# Patient Record
Sex: Male | Born: 1975
Health system: Southern US, Community
[De-identification: ages and names within clinical notes are randomized; demographics above are authoritative.]

---

## 2011-09-09 DIAGNOSIS — IMO0001 Reserved for inherently not codable concepts without codable children: Secondary | ICD-10-CM | POA: Insufficient documentation

## 2012-09-08 DIAGNOSIS — J45909 Unspecified asthma, uncomplicated: Secondary | ICD-10-CM | POA: Insufficient documentation

## 2015-02-05 ENCOUNTER — Ambulatory Visit
Admission: RE | Admit: 2015-02-05 | Discharge: 2015-02-05 | Disposition: A | Discharge status: Home / Self Care | Payer: BLUE CROSS/BLUE SHIELD | Source: Ambulatory Visit | Attending: Physician Assistant | Admitting: Physician Assistant

## 2015-02-05 ENCOUNTER — Other Ambulatory Visit: Payer: Self-pay | Admitting: Physician Assistant

## 2015-02-05 DIAGNOSIS — R52 Pain, unspecified: Secondary | ICD-10-CM

## 2015-02-05 DIAGNOSIS — M7989 Other specified soft tissue disorders: Secondary | ICD-10-CM | POA: Diagnosis present

## 2015-02-05 DIAGNOSIS — M79674 Pain in right toe(s): Secondary | ICD-10-CM | POA: Insufficient documentation

## 2015-02-05 IMAGING — CR DG TOE GREAT 2+V*L*
1 series · 3 of 3 positions shown · non-contrast
Comparison: None available in PACs

CLINICAL DATA: Left great toe pain centered in the interphalangeal
joint, no known injury.

EXAM:
LEFT GREAT TOE

[Series 1: dg toe great left · 0.14mm/px · 3 of 3 slices shown]
[im 1/3]
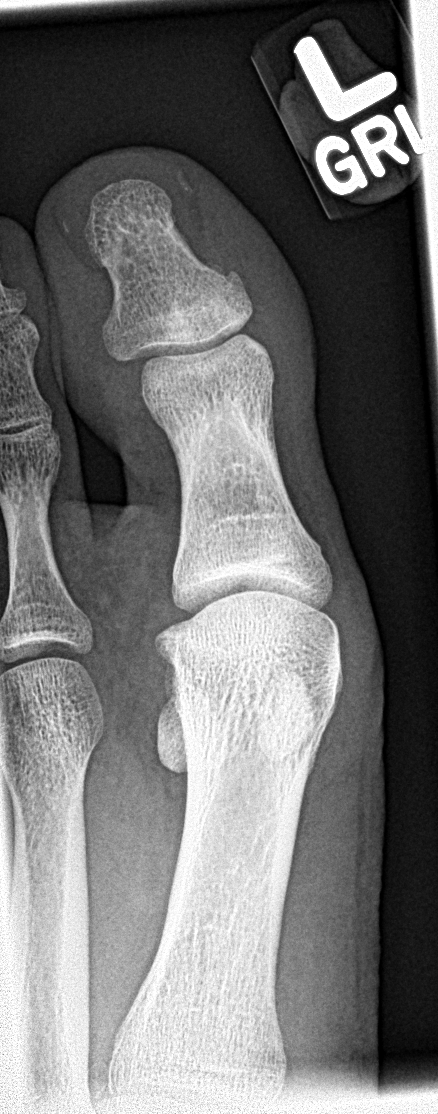
[im 2/3]
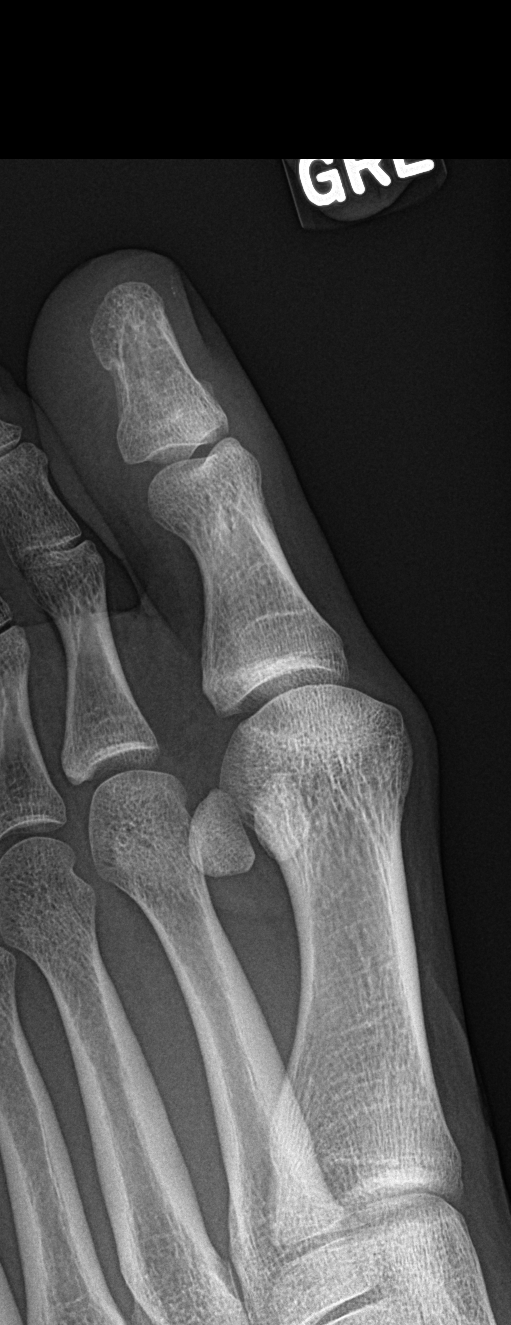
[im 3/3]
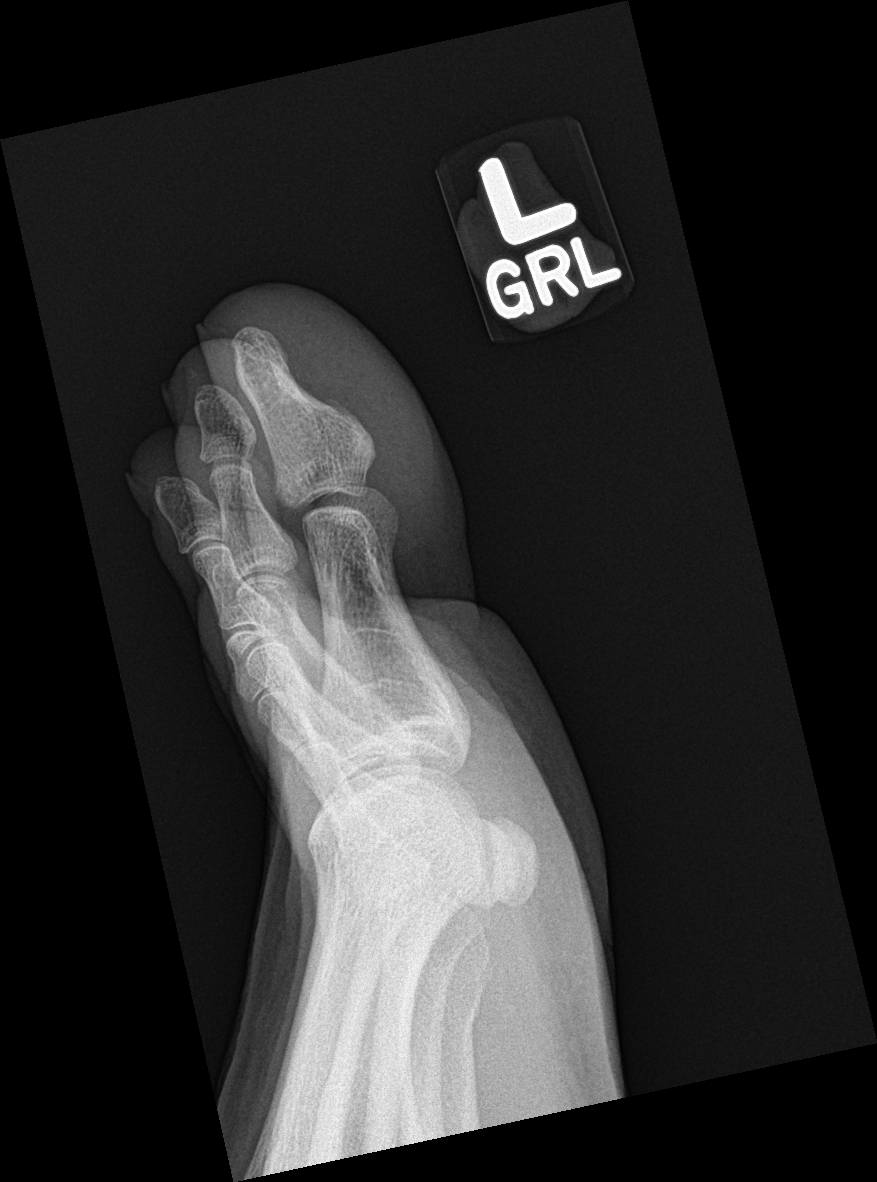

[3 of 3 positions shown; findings below may reference images not displayed]

FINDINGS: The bones of the great toe are adequately mineralized. The
interphalangeal and metatarsophalangeal joints are preserved. There
are no lytic or blastic bony lesions nor is there periosteal
reaction.
IMPRESSION: There is no acute or significant chronic bony abnormality of the
great toe.

## 2015-07-13 ENCOUNTER — Other Ambulatory Visit: Payer: Self-pay | Admitting: Family Medicine

## 2015-07-13 DIAGNOSIS — Q981 Klinefelter syndrome, male with more than two X chromosomes: Secondary | ICD-10-CM

## 2015-07-24 ENCOUNTER — Ambulatory Visit
Admission: RE | Admit: 2015-07-24 | Discharge: 2015-07-24 | Disposition: A | Discharge status: Home / Self Care | Payer: BLUE CROSS/BLUE SHIELD | Source: Ambulatory Visit | Attending: Family Medicine | Admitting: Family Medicine

## 2015-07-24 DIAGNOSIS — Q981 Klinefelter syndrome, male with more than two X chromosomes: Secondary | ICD-10-CM | POA: Diagnosis not present

## 2015-07-26 ENCOUNTER — Ambulatory Visit: Payer: BLUE CROSS/BLUE SHIELD

## 2018-06-16 DIAGNOSIS — R7989 Other specified abnormal findings of blood chemistry: Secondary | ICD-10-CM | POA: Insufficient documentation

## 2018-06-16 DIAGNOSIS — Q984 Klinefelter syndrome, unspecified: Secondary | ICD-10-CM | POA: Insufficient documentation

## 2018-07-08 DIAGNOSIS — H539 Unspecified visual disturbance: Secondary | ICD-10-CM | POA: Insufficient documentation

## 2018-07-08 DIAGNOSIS — H35712 Central serous chorioretinopathy, left eye: Secondary | ICD-10-CM | POA: Insufficient documentation

## 2018-07-08 DIAGNOSIS — I10 Essential (primary) hypertension: Secondary | ICD-10-CM | POA: Insufficient documentation

## 2018-10-12 DIAGNOSIS — M48062 Spinal stenosis, lumbar region with neurogenic claudication: Secondary | ICD-10-CM | POA: Insufficient documentation

## 2018-10-12 DIAGNOSIS — M21372 Foot drop, left foot: Secondary | ICD-10-CM | POA: Insufficient documentation

## 2018-10-12 DIAGNOSIS — M5106 Intervertebral disc disorders with myelopathy, lumbar region: Secondary | ICD-10-CM | POA: Insufficient documentation

## 2018-10-12 DIAGNOSIS — M5440 Lumbago with sciatica, unspecified side: Secondary | ICD-10-CM | POA: Insufficient documentation

## 2018-10-13 HISTORY — PX: BACK SURGERY: SHX140

## 2018-12-16 ENCOUNTER — Ambulatory Visit: Payer: BLUE CROSS/BLUE SHIELD | Admitting: Podiatry

## 2019-01-25 DIAGNOSIS — R7989 Other specified abnormal findings of blood chemistry: Secondary | ICD-10-CM | POA: Diagnosis not present

## 2019-01-25 DIAGNOSIS — Q984 Klinefelter syndrome, unspecified: Secondary | ICD-10-CM | POA: Diagnosis not present

## 2019-01-25 DIAGNOSIS — R718 Other abnormality of red blood cells: Secondary | ICD-10-CM | POA: Diagnosis not present

## 2019-04-06 ENCOUNTER — Other Ambulatory Visit: Payer: Self-pay

## 2019-04-06 DIAGNOSIS — I10 Essential (primary) hypertension: Secondary | ICD-10-CM

## 2019-04-06 DIAGNOSIS — K2100 Gastro-esophageal reflux disease with esophagitis, without bleeding: Secondary | ICD-10-CM

## 2019-04-06 MED ORDER — OMEPRAZOLE 40 MG PO CPDR: 40.0000 mg | DELAYED_RELEASE_CAPSULE | Freq: Every day | ORAL | 0 refills | Status: DC

## 2019-04-06 MED ORDER — LOSARTAN POTASSIUM 25 MG PO TABS: 25.0000 mg | ORAL_TABLET | Freq: Every day | ORAL | 0 refills | Status: DC

## 2019-04-06 NOTE — Telephone Encounter (Signed)
Last CMET 10/15/2018.  Urology managing testosterone injections now dx'd with Klinefelter's since last visit with me in 2019.  Last testosterone level jul 2020 and office visit BP 131/86.  [facility] [employer] physicals optional to employees this year due to covid pandemic.  Due urology follow up Oct 2020.  Magnesium level with next lab draw.  Renal function stable.  Next renal function check and BP due April 2021.  Cozaar initiated by Dr Darnell Level [facility] 06/23/2018 with buspar for hypertension.

## 2019-04-09 DIAGNOSIS — J45909 Unspecified asthma, uncomplicated: Secondary | ICD-10-CM | POA: Diagnosis not present

## 2019-04-14 ENCOUNTER — Ambulatory Visit: Payer: Self-pay

## 2019-04-14 DIAGNOSIS — Z23 Encounter for immunization: Secondary | ICD-10-CM

## 2019-04-18 DIAGNOSIS — R7989 Other specified abnormal findings of blood chemistry: Secondary | ICD-10-CM | POA: Diagnosis not present

## 2019-04-28 ENCOUNTER — Other Ambulatory Visit: Payer: Self-pay

## 2019-04-28 DIAGNOSIS — Z20822 Contact with and (suspected) exposure to covid-19: Secondary | ICD-10-CM

## 2019-04-28 DIAGNOSIS — Z20828 Contact with and (suspected) exposure to other viral communicable diseases: Secondary | ICD-10-CM | POA: Diagnosis not present

## 2019-04-30 LAB — NOVEL CORONAVIRUS, NAA: SARS-CoV-2, NAA: NOT DETECTED

## 2019-06-28 ENCOUNTER — Other Ambulatory Visit: Payer: Self-pay

## 2019-06-28 ENCOUNTER — Encounter: Payer: Self-pay | Admitting: Occupational Medicine

## 2019-06-28 ENCOUNTER — Ambulatory Visit: Payer: 59 | Admitting: Occupational Medicine

## 2019-06-28 VITALS — BP 112/80 | HR 69 | Temp 98.8°F | Resp 12 | Ht 72.0 in | Wt 183.0 lb

## 2019-06-28 DIAGNOSIS — Z0000 Encounter for general adult medical examination without abnormal findings: Secondary | ICD-10-CM

## 2019-06-28 LAB — POCT URINALYSIS DIPSTICK
Bilirubin, UA: NEGATIVE
Blood, UA: NEGATIVE
Glucose, UA: NEGATIVE
Ketones, UA: NEGATIVE
Leukocytes, UA: NEGATIVE
Nitrite, UA: NEGATIVE
Protein, UA: NEGATIVE
Spec Grav, UA: 1.02 (ref 1.010–1.025)
Urobilinogen, UA: 0.2 E.U./dL
pH, UA: 7 (ref 5.0–8.0)

## 2019-06-29 LAB — CMP12+LP+TP+TSH+6AC+PSA+CBC…
ALT: 15 IU/L (ref 0–44)
AST: 15 IU/L (ref 0–40)
Albumin/Globulin Ratio: 2 (ref 1.2–2.2)
Albumin: 4.6 g/dL (ref 4.0–5.0)
Alkaline Phosphatase: 70 IU/L (ref 39–117)
BUN/Creatinine Ratio: 18 (ref 9–20)
BUN: 20 mg/dL (ref 6–24)
Basophils Absolute: 0 10*3/uL (ref 0.0–0.2)
Basos: 1 %
Bilirubin Total: 0.9 mg/dL (ref 0.0–1.2)
Calcium: 9.8 mg/dL (ref 8.7–10.2)
Chloride: 100 mmol/L (ref 96–106)
Chol/HDL Ratio: 4.3 ratio (ref 0.0–5.0)
Cholesterol, Total: 214 mg/dL — ABNORMAL HIGH (ref 100–199)
Creatinine, Ser: 1.13 mg/dL (ref 0.76–1.27)
EOS (ABSOLUTE): 0.4 10*3/uL (ref 0.0–0.4)
Eos: 8 %
Estimated CHD Risk: 0.8 times avg. (ref 0.0–1.0)
Free Thyroxine Index: 1.3 (ref 1.2–4.9)
GFR calc Af Amer: 92 mL/min/{1.73_m2} (ref >59)
GFR calc non Af Amer: 80 mL/min/{1.73_m2} (ref >59)
GGT: 11 IU/L (ref 0–65)
Globulin, Total: 2.3 g/dL (ref 1.5–4.5)
Glucose: 100 mg/dL — ABNORMAL HIGH (ref 65–99)
HDL: 50 mg/dL (ref >39)
Hematocrit: 49.6 % (ref 37.5–51.0)
Hemoglobin: 16.9 g/dL (ref 13.0–17.7)
Immature Grans (Abs): 0 10*3/uL (ref 0.0–0.1)
Immature Granulocytes: 0 %
Iron: 147 ug/dL (ref 38–169)
LDH: 135 IU/L (ref 121–224)
LDL Chol Calc (NIH): 146 mg/dL — ABNORMAL HIGH (ref 0–99)
Lymphocytes Absolute: 2.1 10*3/uL (ref 0.7–3.1)
Lymphs: 40 %
MCH: 30.2 pg (ref 26.6–33.0)
MCHC: 34.1 g/dL (ref 31.5–35.7)
MCV: 89 fL (ref 79–97)
Monocytes Absolute: 0.5 10*3/uL (ref 0.1–0.9)
Monocytes: 9 %
Neutrophils Absolute: 2.2 10*3/uL (ref 1.4–7.0)
Neutrophils: 42 %
Phosphorus: 3.2 mg/dL (ref 2.8–4.1)
Platelets: 173 10*3/uL (ref 150–450)
Potassium: 4.5 mmol/L (ref 3.5–5.2)
Prostate Specific Ag, Serum: 1.3 ng/mL (ref 0.0–4.0)
RBC: 5.59 x10E6/uL (ref 4.14–5.80)
RDW: 11.8 % (ref 11.6–15.4)
Sodium: 139 mmol/L (ref 134–144)
T3 Uptake Ratio: 27 % (ref 24–39)
T4, Total: 4.7 ug/dL (ref 4.5–12.0)
TSH: 2.12 u[IU]/mL (ref 0.450–4.500)
Total Protein: 6.9 g/dL (ref 6.0–8.5)
Triglycerides: 103 mg/dL (ref 0–149)
Uric Acid: 5.3 mg/dL (ref 3.8–8.4)
VLDL Cholesterol Cal: 18 mg/dL (ref 5–40)
WBC: 5.2 10*3/uL (ref 3.4–10.8)

## 2019-07-05 NOTE — Progress Notes (Signed)
       [facility] [employer] annual exam documented in chart at [facility]

## 2019-07-12 ENCOUNTER — Encounter: Payer: Self-pay | Admitting: Registered Nurse

## 2019-07-12 ENCOUNTER — Other Ambulatory Visit: Payer: Self-pay | Admitting: Registered Nurse

## 2019-07-12 NOTE — Telephone Encounter (Signed)
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    Labs renal/liver function and electrolytes WNL  No magnesium.  Consider with next year physical as chronic use.  Hx reflux see outpatient paper record at [place] for Rx history omeprazole.  Electronic Rx omeprazole 40mg  po daily #90 RF3 sent to his pharmacy of choice.

## 2019-07-18 ENCOUNTER — Other Ambulatory Visit: Payer: Self-pay | Admitting: Registered Nurse

## 2019-07-18 DIAGNOSIS — I10 Essential (primary) hypertension: Secondary | ICD-10-CM

## 2019-07-19 ENCOUNTER — Encounter: Payer: Self-pay | Admitting: Registered Nurse

## 2019-07-19 NOTE — Telephone Encounter (Signed)
Labs 06/28/2019 renal and liver function stable.  BP 112/80 hr 69 BMI 24.  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  Electronic Rx sent to patient pharmacy of choice for one year supply cozaar 25mg  po daily #90 RF3.

## 2019-07-22 DIAGNOSIS — K219 Gastro-esophageal reflux disease without esophagitis: Secondary | ICD-10-CM | POA: Diagnosis not present

## 2019-07-23 DIAGNOSIS — Z20828 Contact with and (suspected) exposure to other viral communicable diseases: Secondary | ICD-10-CM | POA: Diagnosis not present

## 2020-02-27 ENCOUNTER — Other Ambulatory Visit: Payer: Self-pay

## 2020-02-27 ENCOUNTER — Ambulatory Visit: Payer: Self-pay

## 2020-02-27 DIAGNOSIS — Z0000 Encounter for general adult medical examination without abnormal findings: Secondary | ICD-10-CM

## 2020-02-27 LAB — POCT URINALYSIS DIPSTICK
Bilirubin, UA: NEGATIVE
Blood, UA: NEGATIVE
Glucose, UA: NEGATIVE
Ketones, UA: NEGATIVE
Leukocytes, UA: NEGATIVE
Nitrite, UA: NEGATIVE
Protein, UA: NEGATIVE
Spec Grav, UA: 1.02 (ref 1.010–1.025)
Urobilinogen, UA: 0.2 E.U./dL
pH, UA: 6 (ref 5.0–8.0)

## 2020-02-28 LAB — CMP12+LP+TP+TSH+6AC+PSA+CBC…
ALT: 21 IU/L (ref 0–44)
AST: 20 IU/L (ref 0–40)
Albumin/Globulin Ratio: 2.1 (ref 1.2–2.2)
Albumin: 4.9 g/dL (ref 4.0–5.0)
Alkaline Phosphatase: 66 IU/L (ref 48–121)
BUN/Creatinine Ratio: 23 — ABNORMAL HIGH (ref 9–20)
BUN: 22 mg/dL (ref 6–24)
Basophils Absolute: 0 10*3/uL (ref 0.0–0.2)
Basos: 0 %
Bilirubin Total: 0.6 mg/dL (ref 0.0–1.2)
Calcium: 9.7 mg/dL (ref 8.7–10.2)
Chloride: 102 mmol/L (ref 96–106)
Chol/HDL Ratio: 4.1 ratio (ref 0.0–5.0)
Cholesterol, Total: 207 mg/dL — ABNORMAL HIGH (ref 100–199)
Creatinine, Ser: 0.97 mg/dL (ref 0.76–1.27)
EOS (ABSOLUTE): 0.4 10*3/uL (ref 0.0–0.4)
Eos: 8 %
Estimated CHD Risk: 0.8 times avg. (ref 0.0–1.0)
Free Thyroxine Index: 1.3 (ref 1.2–4.9)
GFR calc Af Amer: 110 mL/min/{1.73_m2} (ref >59)
GFR calc non Af Amer: 95 mL/min/{1.73_m2} (ref >59)
GGT: 12 IU/L (ref 0–65)
Globulin, Total: 2.3 g/dL (ref 1.5–4.5)
Glucose: 90 mg/dL (ref 65–99)
HDL: 51 mg/dL (ref >39)
Hematocrit: 53.7 % — ABNORMAL HIGH (ref 37.5–51.0)
Hemoglobin: 17.8 g/dL — ABNORMAL HIGH (ref 13.0–17.7)
Immature Grans (Abs): 0 10*3/uL (ref 0.0–0.1)
Immature Granulocytes: 0 %
Iron: 91 ug/dL (ref 38–169)
LDH: 158 IU/L (ref 121–224)
LDL Chol Calc (NIH): 138 mg/dL — ABNORMAL HIGH (ref 0–99)
Lymphocytes Absolute: 2.1 10*3/uL (ref 0.7–3.1)
Lymphs: 42 %
MCH: 29 pg (ref 26.6–33.0)
MCHC: 33.1 g/dL (ref 31.5–35.7)
MCV: 88 fL (ref 79–97)
Monocytes Absolute: 0.5 10*3/uL (ref 0.1–0.9)
Monocytes: 10 %
Neutrophils Absolute: 2.1 10*3/uL (ref 1.4–7.0)
Neutrophils: 40 %
Phosphorus: 2.9 mg/dL (ref 2.8–4.1)
Platelets: 198 10*3/uL (ref 150–450)
Potassium: 4.8 mmol/L (ref 3.5–5.2)
Prostate Specific Ag, Serum: 1.3 ng/mL (ref 0.0–4.0)
RBC: 6.13 x10E6/uL — ABNORMAL HIGH (ref 4.14–5.80)
RDW: 13.2 % (ref 11.6–15.4)
Sodium: 140 mmol/L (ref 134–144)
T3 Uptake Ratio: 28 % (ref 24–39)
T4, Total: 4.7 ug/dL (ref 4.5–12.0)
TSH: 1.73 u[IU]/mL (ref 0.450–4.500)
Total Protein: 7.2 g/dL (ref 6.0–8.5)
Triglycerides: 101 mg/dL (ref 0–149)
Uric Acid: 4.7 mg/dL (ref 3.8–8.4)
VLDL Cholesterol Cal: 18 mg/dL (ref 5–40)
WBC: 5.1 10*3/uL (ref 3.4–10.8)

## 2020-02-29 LAB — QUANTIFERON-TB GOLD PLUS
QuantiFERON Mitogen Value: >10 IU/mL
QuantiFERON Nil Value: 0.02 IU/mL
QuantiFERON TB1 Ag Value: 0.01 IU/mL
QuantiFERON TB2 Ag Value: 0.03 IU/mL
QuantiFERON-TB Gold Plus: NEGATIVE

## 2020-03-13 ENCOUNTER — Encounter: Payer: Self-pay | Admitting: Emergency Medicine

## 2020-03-13 ENCOUNTER — Ambulatory Visit: Payer: Self-pay | Admitting: Emergency Medicine

## 2020-03-13 ENCOUNTER — Other Ambulatory Visit: Payer: Self-pay

## 2020-03-13 VITALS — BP 130/80 | HR 70 | Temp 97.3°F | Resp 12 | Ht 72.0 in | Wt 184.0 lb

## 2020-03-13 DIAGNOSIS — Z0000 Encounter for general adult medical examination without abnormal findings: Secondary | ICD-10-CM

## 2020-03-13 NOTE — Progress Notes (Signed)
Occupational Health Provider Note       Time seen: 8:47 AM    I have reviewed the vital signs and the nursing notes.  HISTORY   Chief Complaint Employment Physical Engineer, drilling)    HPI Zachary Gibson is a 44 y.o. male with a history of Back surgery  who presents today for annual physical examination.  Patient denies any complaints, has no concerns or recent sickness.  History reviewed. No pertinent past medical history.  Past Surgical History:  Procedure Laterality Date  . BACK SURGERY  10/2018   L4-L5    Allergies Patient has no known allergies.   Review of Systems Constitutional: Negative for fever. Cardiovascular: Negative for chest pain. Respiratory: Negative for shortness of breath. Gastrointestinal: Negative for abdominal pain, vomiting and diarrhea. Musculoskeletal: Negative for back pain. Skin: Negative for rash. Neurological: Negative for headaches, focal weakness or numbness.  All systems negative/normal/unremarkable except as stated in the HPI  ____________________________________________   PHYSICAL EXAM:  VITAL SIGNS: Vitals:   03/13/20 0839  BP: 130/80  Pulse: 70  Resp: 12  Temp: (!) 97.3 F (36.3 C)  SpO2: 99%    Constitutional: Alert and oriented. Well appearing and in no distress. Eyes: Conjunctivae are normal. Normal extraocular movements. ENT      Head: Normocephalic and atraumatic.      Nose: No congestion/rhinnorhea.      Mouth/Throat: Mucous membranes are moist.      Neck: No stridor. Cardiovascular: Normal rate, regular rhythm. No murmurs, rubs, or gallops. Respiratory: Normal respiratory effort without tachypnea nor retractions. Breath sounds are clear and equal bilaterally. No wheezes/rales/rhonchi. Gastrointestinal: Soft and nontender. Normal bowel sounds Musculoskeletal: Nontender with normal range of motion in extremities. No lower extremity tenderness nor edema. Neurologic:  Normal speech and language. No gross focal  neurologic deficits are appreciated.  Skin:  Skin is warm, dry and intact. No rash noted. Psychiatric: Speech and behavior are normal.  ____________________________________________  EKG: Interpreted by me.  Sinus bradycardia with rate of 51 bpm, normal PR interval, normal QRS, nonspecific ST repolarization variant, normal QT  ____________________________________________   LABS (pertinent positives/negatives)  Recent Results (from the past 2160 hour(s))  CMP12+LP+TP+TSH+6AC+PSA+CBC.     Status: Abnormal   Collection Time: 02/27/20  9:00 AM  Result Value Ref Range   Glucose 90 65 - 99 mg/dL   Uric Acid 4.7 3.8 - 8.4 mg/dL    Comment:            Therapeutic target for gout patients: <6.0   BUN 22 6 - 24 mg/dL   Creatinine, Ser 0.97 0.76 - 1.27 mg/dL   GFR calc non Af Amer 95 >59 mL/min/1.73   GFR calc Af Amer 110 >59 mL/min/1.73    Comment: **[facility] currently reports eGFR in compliance with the current**   recommendations of the [employer]. [facility] will   update reporting as new guidelines are published from the NKF-ASN   Task force.    BUN/Creatinine Ratio 23 (H) 9 - 20   Sodium 140 134 - 144 mmol/L   Potassium 4.8 3.5 - 5.2 mmol/L   Chloride 102 96 - 106 mmol/L   Calcium 9.7 8.7 - 10.2 mg/dL   Phosphorus 2.9 2.8 - 4.1 mg/dL   Total Protein 7.2 6.0 - 8.5 g/dL   Albumin 4.9 4.0 - 5.0 g/dL   Globulin, Total 2.3 1.5 - 4.5 g/dL   Albumin/Globulin Ratio 2.1 1.2 - 2.2   Bilirubin Total 0.6 0.0 - 1.2 mg/dL  Alkaline Phosphatase 66 48 - 121 IU/L   LDH 158 121 - 224 IU/L   AST 20 0 - 40 IU/L   ALT 21 0 - 44 IU/L   GGT 12 0 - 65 IU/L   Iron 91 38 - 169 ug/dL   Cholesterol, Total 207 (H) 100 - 199 mg/dL   Triglycerides 101 0 - 149 mg/dL   HDL 51 >39 mg/dL   VLDL Cholesterol Cal 18 5 - 40 mg/dL   LDL Chol Calc (NIH) 138 (H) 0 - 99 mg/dL   Chol/HDL Ratio 4.1 0.0 - 5.0 ratio    Comment:                                   T. Chol/HDL Ratio                                              Men  Women                               1/2 Avg.Risk  3.4    3.3                                   Avg.Risk  5.0    4.4                                2X Avg.Risk  9.6    7.1                                3X Avg.Risk 23.4   11.0    Estimated CHD Risk 0.8 0.0 - 1.0 times avg.    Comment: The CHD Risk is based on the T. Chol/HDL ratio. Other factors affect CHD Risk such as hypertension, smoking, diabetes, severe obesity, and family history of premature CHD.    TSH 1.730 0.450 - 4.500 uIU/mL   T4, Total 4.7 4.5 - 12.0 ug/dL   T3 Uptake Ratio 28 24 - 39 %   Free Thyroxine Index 1.3 1.2 - 4.9   Prostate Specific Ag, Serum 1.3 0.0 - 4.0 ng/mL    Comment: Roche ECLIA methodology. According to the American Urological Association, Serum PSA should decrease and remain at undetectable levels after radical prostatectomy. The AUA defines biochemical recurrence as an initial PSA value 0.2 ng/mL or greater followed by a subsequent confirmatory PSA value 0.2 ng/mL or greater. Values obtained with different assay methods or kits cannot be used interchangeably. Results cannot be interpreted as absolute evidence of the presence or absence of malignant disease.    WBC 5.1 3.4 - 10.8 x10E3/uL   RBC 6.13 (H) 4.14 - 5.80 x10E6/uL   Hemoglobin 17.8 (H) 13.0 - 17.7 g/dL   Hematocrit 53.7 (H) 37.5 - 51.0 %   MCV 88 79 - 97 fL   MCH 29.0 26.6 - 33.0 pg   MCHC 33.1 31 - 35 g/dL   RDW 13.2 11.6 - 15.4 %   Platelets 198 150 - 450 x10E3/uL   Neutrophils 40 Not Estab. %  Lymphs 42 Not Estab. %   Monocytes 10 Not Estab. %   Eos 8 Not Estab. %   Basos 0 Not Estab. %   Neutrophils Absolute 2.1 1 - 7 x10E3/uL   Lymphocytes Absolute 2.1 0 - 3 x10E3/uL   Monocytes Absolute 0.5 0 - 0 x10E3/uL   EOS (ABSOLUTE) 0.4 0.0 - 0.4 x10E3/uL   Basophils Absolute 0.0 0 - 0 x10E3/uL   Immature Granulocytes 0 Not Estab. %   Immature Grans (Abs) 0.0 0.0 - 0.1 x10E3/uL  QuantiFERON-TB Gold Plus      Status: None   Collection Time: 02/27/20  9:00 AM  Result Value Ref Range   QuantiFERON Incubation Incubation performed.    QuantiFERON Criteria Comment     Comment: The QuantiFERON-TB Gold Plus result is determined by subtracting the Nil value from either TB antigen (Ag) tube. The mitogen tube serves as a control for the test.    QuantiFERON TB1 Ag Value 0.01 IU/mL   QuantiFERON TB2 Ag Value 0.03 IU/mL   QuantiFERON Nil Value 0.02 IU/mL   QuantiFERON Mitogen Value >10.00 IU/mL   QuantiFERON-TB Gold Plus Negative Negative    Comment: Chemiluminescence immunoassay methodology  POCT urinalysis dipstick     Status: Normal   Collection Time: 02/27/20  9:39 AM  Result Value Ref Range   Color, UA yellow    Clarity, UA clear    Glucose, UA Negative Negative   Bilirubin, UA negative    Ketones, UA negative    Spec Grav, UA 1.020 1.010 - 1.025   Blood, UA negative    pH, UA 6.0 5.0 - 8.0   Protein, UA Negative Negative   Urobilinogen, UA 0.2 0.2 or 1.0 E.U./dL   Nitrite, UA negative    Leukocytes, UA Negative Negative   Appearance dark    Odor       ASSESSMENT AND PLAN  Annual physical examination   Plan: The patient had presented for annual physical exam. Patient's labs do reveal mild hyperlipidemia but otherwise is reassuring.  He is cleared for duty at this time.  Lenise Arena MD    Note: This note was generated in part or whole with voice recognition software. Voice recognition is usually quite accurate but there are transcription errors that can and very often do occur. I apologize for any typographical errors that were not detected and corrected.

## 2020-05-17 ENCOUNTER — Other Ambulatory Visit: Payer: Self-pay

## 2020-05-17 ENCOUNTER — Ambulatory Visit: Payer: Self-pay

## 2020-05-17 DIAGNOSIS — Z1152 Encounter for screening for COVID-19: Secondary | ICD-10-CM

## 2020-05-17 DIAGNOSIS — Z23 Encounter for immunization: Secondary | ICD-10-CM

## 2020-05-18 LAB — NOVEL CORONAVIRUS, NAA: SARS-CoV-2, NAA: NOT DETECTED

## 2020-05-18 LAB — SARS-COV-2, NAA 2 DAY TAT

## 2020-05-21 ENCOUNTER — Other Ambulatory Visit: Payer: 59

## 2020-08-14 DIAGNOSIS — I10 Essential (primary) hypertension: Secondary | ICD-10-CM | POA: Diagnosis not present

## 2020-08-14 DIAGNOSIS — Z1329 Encounter for screening for other suspected endocrine disorder: Secondary | ICD-10-CM | POA: Diagnosis not present

## 2020-08-14 DIAGNOSIS — Z23 Encounter for immunization: Secondary | ICD-10-CM | POA: Diagnosis not present

## 2020-08-14 DIAGNOSIS — Z130 Encounter for screening for diseases of the blood and blood-forming organs and certain disorders involving the immune mechanism: Secondary | ICD-10-CM | POA: Diagnosis not present

## 2020-08-14 DIAGNOSIS — Z13228 Encounter for screening for other metabolic disorders: Secondary | ICD-10-CM | POA: Diagnosis not present

## 2020-08-14 DIAGNOSIS — Z136 Encounter for screening for cardiovascular disorders: Secondary | ICD-10-CM | POA: Diagnosis not present

## 2021-01-22 DIAGNOSIS — M5136 Other intervertebral disc degeneration, lumbar region: Secondary | ICD-10-CM | POA: Diagnosis not present

## 2021-01-22 DIAGNOSIS — M48062 Spinal stenosis, lumbar region with neurogenic claudication: Secondary | ICD-10-CM | POA: Diagnosis not present

## 2021-01-22 DIAGNOSIS — M5432 Sciatica, left side: Secondary | ICD-10-CM | POA: Diagnosis not present

## 2021-01-22 DIAGNOSIS — M961 Postlaminectomy syndrome, not elsewhere classified: Secondary | ICD-10-CM | POA: Diagnosis not present

## 2021-01-25 DIAGNOSIS — M47816 Spondylosis without myelopathy or radiculopathy, lumbar region: Secondary | ICD-10-CM | POA: Diagnosis not present

## 2021-01-25 DIAGNOSIS — M5137 Other intervertebral disc degeneration, lumbosacral region: Secondary | ICD-10-CM | POA: Diagnosis not present

## 2021-01-25 DIAGNOSIS — M47817 Spondylosis without myelopathy or radiculopathy, lumbosacral region: Secondary | ICD-10-CM | POA: Diagnosis not present

## 2021-01-25 DIAGNOSIS — M48061 Spinal stenosis, lumbar region without neurogenic claudication: Secondary | ICD-10-CM | POA: Diagnosis not present

## 2021-01-25 DIAGNOSIS — M5136 Other intervertebral disc degeneration, lumbar region: Secondary | ICD-10-CM | POA: Diagnosis not present

## 2021-01-25 DIAGNOSIS — M4807 Spinal stenosis, lumbosacral region: Secondary | ICD-10-CM | POA: Diagnosis not present

## 2021-01-25 DIAGNOSIS — M25552 Pain in left hip: Secondary | ICD-10-CM | POA: Diagnosis not present

## 2021-01-25 DIAGNOSIS — M79605 Pain in left leg: Secondary | ICD-10-CM | POA: Diagnosis not present

## 2021-01-28 DIAGNOSIS — M48062 Spinal stenosis, lumbar region with neurogenic claudication: Secondary | ICD-10-CM | POA: Diagnosis not present

## 2021-01-28 DIAGNOSIS — M961 Postlaminectomy syndrome, not elsewhere classified: Secondary | ICD-10-CM | POA: Diagnosis not present

## 2021-01-28 DIAGNOSIS — M5106 Intervertebral disc disorders with myelopathy, lumbar region: Secondary | ICD-10-CM | POA: Diagnosis not present

## 2021-02-01 DIAGNOSIS — M48061 Spinal stenosis, lumbar region without neurogenic claudication: Secondary | ICD-10-CM | POA: Diagnosis not present

## 2021-02-01 DIAGNOSIS — M5416 Radiculopathy, lumbar region: Secondary | ICD-10-CM | POA: Diagnosis not present

## 2021-02-01 DIAGNOSIS — M5136 Other intervertebral disc degeneration, lumbar region: Secondary | ICD-10-CM | POA: Diagnosis not present

## 2021-02-07 DIAGNOSIS — M5136 Other intervertebral disc degeneration, lumbar region: Secondary | ICD-10-CM | POA: Diagnosis not present

## 2021-02-07 DIAGNOSIS — M5416 Radiculopathy, lumbar region: Secondary | ICD-10-CM | POA: Diagnosis not present

## 2021-02-18 DIAGNOSIS — M5416 Radiculopathy, lumbar region: Secondary | ICD-10-CM | POA: Diagnosis not present

## 2021-02-18 DIAGNOSIS — M48061 Spinal stenosis, lumbar region without neurogenic claudication: Secondary | ICD-10-CM | POA: Diagnosis not present

## 2021-02-18 DIAGNOSIS — M5136 Other intervertebral disc degeneration, lumbar region: Secondary | ICD-10-CM | POA: Diagnosis not present

## 2021-03-08 ENCOUNTER — Ambulatory Visit: Payer: Self-pay

## 2021-03-08 ENCOUNTER — Other Ambulatory Visit: Payer: Self-pay

## 2021-03-08 DIAGNOSIS — Z0000 Encounter for general adult medical examination without abnormal findings: Secondary | ICD-10-CM

## 2021-03-08 DIAGNOSIS — M79605 Pain in left leg: Secondary | ICD-10-CM | POA: Diagnosis not present

## 2021-03-08 DIAGNOSIS — M5459 Other low back pain: Secondary | ICD-10-CM | POA: Diagnosis not present

## 2021-03-08 LAB — POCT URINALYSIS DIPSTICK
Appearance: NORMAL
Bilirubin, UA: NEGATIVE
Blood, UA: NEGATIVE
Glucose, UA: NEGATIVE
Ketones, UA: NEGATIVE
Leukocytes, UA: NEGATIVE
Nitrite, UA: NEGATIVE
Protein, UA: NEGATIVE
Spec Grav, UA: 1.015 (ref 1.010–1.025)
Urobilinogen, UA: 0.2 E.U./dL
pH, UA: 7 (ref 5.0–8.0)

## 2021-03-08 NOTE — Progress Notes (Signed)
Annual physical scheduled 03/14/21  Reviewed CDC recommendations for importance of HIV/ Hep C screening once in lifetime. Patient has declined HIV / Hep C screenings today and will let us know if they should change their mind in the future.

## 2021-03-09 LAB — CMP12+LP+TP+TSH+6AC+PSA+CBC…
ALT: 21 IU/L (ref 0–44)
AST: 19 IU/L (ref 0–40)
Albumin/Globulin Ratio: 2.3 — ABNORMAL HIGH (ref 1.2–2.2)
Albumin: 4.8 g/dL (ref 4.0–5.0)
Alkaline Phosphatase: 67 IU/L (ref 44–121)
BUN/Creatinine Ratio: 16 (ref 9–20)
BUN: 16 mg/dL (ref 6–24)
Basophils Absolute: 0 10*3/uL (ref 0.0–0.2)
Basos: 0 %
Bilirubin Total: 0.7 mg/dL (ref 0.0–1.2)
Calcium: 9.6 mg/dL (ref 8.7–10.2)
Chloride: 101 mmol/L (ref 96–106)
Chol/HDL Ratio: 4 ratio (ref 0.0–5.0)
Cholesterol, Total: 179 mg/dL (ref 100–199)
Creatinine, Ser: 1.03 mg/dL (ref 0.76–1.27)
EOS (ABSOLUTE): 0.2 10*3/uL (ref 0.0–0.4)
Eos: 4 %
Estimated CHD Risk: 0.7 times avg. (ref 0.0–1.0)
Free Thyroxine Index: 1.5 (ref 1.2–4.9)
GGT: 13 IU/L (ref 0–65)
Globulin, Total: 2.1 g/dL (ref 1.5–4.5)
Glucose: 95 mg/dL (ref 65–99)
HDL: 45 mg/dL (ref >39)
Hematocrit: 47.8 % (ref 37.5–51.0)
Hemoglobin: 16.3 g/dL (ref 13.0–17.7)
Immature Grans (Abs): 0 10*3/uL (ref 0.0–0.1)
Immature Granulocytes: 0 %
Iron: 134 ug/dL (ref 38–169)
LDH: 155 IU/L (ref 121–224)
LDL Chol Calc (NIH): 111 mg/dL — ABNORMAL HIGH (ref 0–99)
Lymphocytes Absolute: 2.3 10*3/uL (ref 0.7–3.1)
Lymphs: 47 %
MCH: 29.8 pg (ref 26.6–33.0)
MCHC: 34.1 g/dL (ref 31.5–35.7)
MCV: 87 fL (ref 79–97)
Monocytes Absolute: 0.6 10*3/uL (ref 0.1–0.9)
Monocytes: 12 %
Neutrophils Absolute: 1.8 10*3/uL (ref 1.4–7.0)
Neutrophils: 37 %
Phosphorus: 2.8 mg/dL (ref 2.8–4.1)
Platelets: 189 10*3/uL (ref 150–450)
Potassium: 4.4 mmol/L (ref 3.5–5.2)
Prostate Specific Ag, Serum: 1.3 ng/mL (ref 0.0–4.0)
RBC: 5.47 x10E6/uL (ref 4.14–5.80)
RDW: 12.5 % (ref 11.6–15.4)
Sodium: 139 mmol/L (ref 134–144)
T3 Uptake Ratio: 28 % (ref 24–39)
T4, Total: 5.4 ug/dL (ref 4.5–12.0)
TSH: 2.02 u[IU]/mL (ref 0.450–4.500)
Total Protein: 6.9 g/dL (ref 6.0–8.5)
Triglycerides: 130 mg/dL (ref 0–149)
Uric Acid: 5.1 mg/dL (ref 3.8–8.4)
VLDL Cholesterol Cal: 23 mg/dL (ref 5–40)
WBC: 4.8 10*3/uL (ref 3.4–10.8)
eGFR: 92 mL/min/{1.73_m2} (ref >59)

## 2021-03-11 DIAGNOSIS — M79605 Pain in left leg: Secondary | ICD-10-CM | POA: Diagnosis not present

## 2021-03-11 DIAGNOSIS — M5459 Other low back pain: Secondary | ICD-10-CM | POA: Diagnosis not present

## 2021-03-14 ENCOUNTER — Encounter: Payer: 59 | Admitting: Physician Assistant

## 2021-03-14 ENCOUNTER — Ambulatory Visit: Payer: Self-pay | Admitting: Physician Assistant

## 2021-03-14 ENCOUNTER — Encounter: Payer: Self-pay | Admitting: Physician Assistant

## 2021-03-14 ENCOUNTER — Other Ambulatory Visit: Payer: Self-pay

## 2021-03-14 VITALS — BP 126/83 | HR 83 | Temp 97.8°F | Resp 12 | Ht 72.0 in | Wt 190.0 lb

## 2021-03-14 DIAGNOSIS — M5459 Other low back pain: Secondary | ICD-10-CM | POA: Diagnosis not present

## 2021-03-14 DIAGNOSIS — M79605 Pain in left leg: Secondary | ICD-10-CM | POA: Diagnosis not present

## 2021-03-14 DIAGNOSIS — Z0000 Encounter for general adult medical examination without abnormal findings: Secondary | ICD-10-CM

## 2021-03-14 NOTE — Progress Notes (Signed)
Pt denies any issues or concerns at this time/CL,RMA ?

## 2021-03-14 NOTE — Progress Notes (Signed)
   Subjective: Annual physical exam    Patient ID: Zachary Gibson, male    DOB: 1975/12/22, 45 y.o.   MRN: [id number]  HPI Patient presented annual physical exam.  No concerning complaints.   Review of Systems Anxiety, GERD, hypertension, Klinefelter syndrome, and lumbar stenosis.    Objective:   Physical Exam No acute distress.  Temperature 97.8, pulse 83, respiration 12, BP is 126/83, and patient 90% O2 sat on room air.  Patient weighs 190 pounds and BMI is 25.8. HEENT was unremarkable.  Neck was supple without adenopathy or bruits.  Lungs are clear to auscultation.  Heart was regular rate and rhythm.  EKG showed asymptomatic bradycardia. Abdomen with negative HSM, normoactive bowel sounds, soft, nontender to palpation. No obvious deformity to the upper or lower extremities.  Patient has full and equal range of motion of the upper and lower extremities. No obvious deformity to the cervical lumbar spine.  Patient had full and equal range of motion cervical lumbar spine. Cranial nerves II through XII are grossly intact.  DTRs are 2+ without clonus.       Assessment & Plan: Well exam.   Discussed no acute findings on lab results with patient.  Advised to follow-up as necessary.

## 2021-03-20 DIAGNOSIS — M5459 Other low back pain: Secondary | ICD-10-CM | POA: Diagnosis not present

## 2021-03-20 DIAGNOSIS — M79605 Pain in left leg: Secondary | ICD-10-CM | POA: Diagnosis not present

## 2021-03-25 DIAGNOSIS — M5459 Other low back pain: Secondary | ICD-10-CM | POA: Diagnosis not present

## 2021-03-25 DIAGNOSIS — M79605 Pain in left leg: Secondary | ICD-10-CM | POA: Diagnosis not present

## 2021-03-29 DIAGNOSIS — M5459 Other low back pain: Secondary | ICD-10-CM | POA: Diagnosis not present

## 2021-03-29 DIAGNOSIS — M79605 Pain in left leg: Secondary | ICD-10-CM | POA: Diagnosis not present

## 2021-04-01 DIAGNOSIS — M79605 Pain in left leg: Secondary | ICD-10-CM | POA: Diagnosis not present

## 2021-04-01 DIAGNOSIS — M5459 Other low back pain: Secondary | ICD-10-CM | POA: Diagnosis not present

## 2021-04-04 DIAGNOSIS — M5459 Other low back pain: Secondary | ICD-10-CM | POA: Diagnosis not present

## 2021-04-04 DIAGNOSIS — M79605 Pain in left leg: Secondary | ICD-10-CM | POA: Diagnosis not present

## 2021-04-10 DIAGNOSIS — M79605 Pain in left leg: Secondary | ICD-10-CM | POA: Diagnosis not present

## 2021-04-10 DIAGNOSIS — M5459 Other low back pain: Secondary | ICD-10-CM | POA: Diagnosis not present

## 2021-04-19 DIAGNOSIS — M5459 Other low back pain: Secondary | ICD-10-CM | POA: Diagnosis not present

## 2021-04-19 DIAGNOSIS — M79605 Pain in left leg: Secondary | ICD-10-CM | POA: Diagnosis not present

## 2021-04-24 ENCOUNTER — Other Ambulatory Visit: Payer: Self-pay

## 2021-04-24 NOTE — Progress Notes (Signed)
ETOH: cleared   UDS: pending [facility] specimen ID: [id number]

## 2021-04-25 DIAGNOSIS — M79605 Pain in left leg: Secondary | ICD-10-CM | POA: Diagnosis not present

## 2021-04-25 DIAGNOSIS — M5459 Other low back pain: Secondary | ICD-10-CM | POA: Diagnosis not present

## 2021-05-01 DIAGNOSIS — M5459 Other low back pain: Secondary | ICD-10-CM | POA: Diagnosis not present

## 2021-05-01 DIAGNOSIS — M79605 Pain in left leg: Secondary | ICD-10-CM | POA: Diagnosis not present

## 2021-05-16 DIAGNOSIS — M79605 Pain in left leg: Secondary | ICD-10-CM | POA: Diagnosis not present

## 2021-05-16 DIAGNOSIS — M5459 Other low back pain: Secondary | ICD-10-CM | POA: Diagnosis not present

## 2021-06-12 DIAGNOSIS — M5459 Other low back pain: Secondary | ICD-10-CM | POA: Diagnosis not present

## 2021-06-12 DIAGNOSIS — M79605 Pain in left leg: Secondary | ICD-10-CM | POA: Diagnosis not present

## 2021-07-04 DIAGNOSIS — M5459 Other low back pain: Secondary | ICD-10-CM | POA: Diagnosis not present

## 2021-07-04 DIAGNOSIS — M79605 Pain in left leg: Secondary | ICD-10-CM | POA: Diagnosis not present

## 2021-09-05 ENCOUNTER — Other Ambulatory Visit: Payer: Self-pay | Admitting: Emergency Medicine

## 2021-09-06 ENCOUNTER — Telehealth: Payer: Self-pay

## 2021-09-06 ENCOUNTER — Other Ambulatory Visit: Payer: Self-pay | Admitting: Emergency Medicine

## 2021-09-06 NOTE — Telephone Encounter (Signed)
Called [place] to check on Testosterone refill that was sent to our clinic for refill. We have not checked a testosterone level since 2020. [place] said Dr. Lenise Arena is his PCP & Rx refill should go to him [place] will contact [facility] to have Rx refill sent to Dr. Lenise Arena at his current practice.  AMD

## 2021-12-31 DIAGNOSIS — M7712 Lateral epicondylitis, left elbow: Secondary | ICD-10-CM | POA: Diagnosis not present

## 2022-01-18 ENCOUNTER — Other Ambulatory Visit: Payer: Self-pay | Admitting: Emergency Medicine

## 2022-02-25 DIAGNOSIS — B078 Other viral warts: Secondary | ICD-10-CM | POA: Diagnosis not present

## 2022-02-25 DIAGNOSIS — Z09 Encounter for follow-up examination after completed treatment for conditions other than malignant neoplasm: Secondary | ICD-10-CM | POA: Diagnosis not present

## 2022-02-25 DIAGNOSIS — Z86018 Personal history of other benign neoplasm: Secondary | ICD-10-CM | POA: Diagnosis not present

## 2022-02-25 DIAGNOSIS — D225 Melanocytic nevi of trunk: Secondary | ICD-10-CM | POA: Diagnosis not present

## 2022-02-25 DIAGNOSIS — L578 Other skin changes due to chronic exposure to nonionizing radiation: Secondary | ICD-10-CM | POA: Diagnosis not present

## 2022-03-18 ENCOUNTER — Ambulatory Visit: Payer: 59

## 2022-03-18 DIAGNOSIS — Z0000 Encounter for general adult medical examination without abnormal findings: Secondary | ICD-10-CM

## 2022-03-18 LAB — POCT URINALYSIS DIPSTICK
Bilirubin, UA: NEGATIVE
Blood, UA: NEGATIVE
Glucose, UA: NEGATIVE
Ketones, UA: NEGATIVE
Leukocytes, UA: NEGATIVE
Nitrite, UA: NEGATIVE
Protein, UA: NEGATIVE
Spec Grav, UA: 1.02 (ref 1.010–1.025)
Urobilinogen, UA: 0.2 E.U./dL
pH, UA: 7 (ref 5.0–8.0)

## 2022-03-18 NOTE — Progress Notes (Signed)
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

## 2022-03-19 LAB — CMP12+LP+TP+TSH+6AC+PSA+CBC…
ALT: 36 IU/L (ref 0–44)
AST: 25 IU/L (ref 0–40)
Albumin/Globulin Ratio: 2.6 — ABNORMAL HIGH (ref 1.2–2.2)
Albumin: 4.6 g/dL (ref 4.1–5.1)
Alkaline Phosphatase: 64 IU/L (ref 44–121)
BUN/Creatinine Ratio: 15 (ref 9–20)
BUN: 16 mg/dL (ref 6–24)
Basophils Absolute: 0 10*3/uL (ref 0.0–0.2)
Basos: 1 %
Bilirubin Total: 0.6 mg/dL (ref 0.0–1.2)
Calcium: 9.6 mg/dL (ref 8.7–10.2)
Chloride: 102 mmol/L (ref 96–106)
Chol/HDL Ratio: 4.2 ratio (ref 0.0–5.0)
Cholesterol, Total: 189 mg/dL (ref 100–199)
Creatinine, Ser: 1.08 mg/dL (ref 0.76–1.27)
EOS (ABSOLUTE): 0.2 10*3/uL (ref 0.0–0.4)
Eos: 4 %
Estimated CHD Risk: 0.8 times avg. (ref 0.0–1.0)
Free Thyroxine Index: 1.5 (ref 1.2–4.9)
GGT: 15 IU/L (ref 0–65)
Globulin, Total: 1.8 g/dL (ref 1.5–4.5)
Glucose: 97 mg/dL (ref 70–99)
HDL: 45 mg/dL (ref >39)
Hematocrit: 47 % (ref 37.5–51.0)
Hemoglobin: 16.2 g/dL (ref 13.0–17.7)
Immature Grans (Abs): 0 10*3/uL (ref 0.0–0.1)
Immature Granulocytes: 0 %
Iron: 115 ug/dL (ref 38–169)
LDH: 147 IU/L (ref 121–224)
LDL Chol Calc (NIH): 126 mg/dL — ABNORMAL HIGH (ref 0–99)
Lymphocytes Absolute: 2.1 10*3/uL (ref 0.7–3.1)
Lymphs: 43 %
MCH: 29.7 pg (ref 26.6–33.0)
MCHC: 34.5 g/dL (ref 31.5–35.7)
MCV: 86 fL (ref 79–97)
Monocytes Absolute: 0.5 10*3/uL (ref 0.1–0.9)
Monocytes: 9 %
Neutrophils Absolute: 2.1 10*3/uL (ref 1.4–7.0)
Neutrophils: 43 %
Phosphorus: 2.7 mg/dL — ABNORMAL LOW (ref 2.8–4.1)
Platelets: 185 10*3/uL (ref 150–450)
Potassium: 4.2 mmol/L (ref 3.5–5.2)
Prostate Specific Ag, Serum: 1.3 ng/mL (ref 0.0–4.0)
RBC: 5.45 x10E6/uL (ref 4.14–5.80)
RDW: 12.5 % (ref 11.6–15.4)
Sodium: 139 mmol/L (ref 134–144)
T3 Uptake Ratio: 30 % (ref 24–39)
T4, Total: 4.9 ug/dL (ref 4.5–12.0)
TSH: 2.18 u[IU]/mL (ref 0.450–4.500)
Total Protein: 6.4 g/dL (ref 6.0–8.5)
Triglycerides: 96 mg/dL (ref 0–149)
Uric Acid: 5.3 mg/dL (ref 3.8–8.4)
VLDL Cholesterol Cal: 18 mg/dL (ref 5–40)
WBC: 4.9 10*3/uL (ref 3.4–10.8)
eGFR: 86 mL/min/{1.73_m2} (ref >59)

## 2022-03-24 ENCOUNTER — Encounter: Payer: Self-pay | Admitting: Physician Assistant

## 2022-03-24 ENCOUNTER — Ambulatory Visit: Payer: Self-pay | Admitting: Physician Assistant

## 2022-03-24 VITALS — BP 126/91 | HR 70 | Temp 98.4°F | Resp 14 | Ht 72.0 in | Wt 190.0 lb

## 2022-03-24 DIAGNOSIS — Z0000 Encounter for general adult medical examination without abnormal findings: Secondary | ICD-10-CM

## 2022-03-24 NOTE — Progress Notes (Signed)
[facility] [place] [facility]  ____________________________________________   None    (approximate)  I have reviewed the triage vital signs and the nursing notes.   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  Patient voiced no concerns or complaints.        History reviewed. No pertinent past medical history.  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 Authorizing Provider  budesonide-formoterol (SYMBICORT) 80-4.5 MCG/ACT inhaler INHALE 2 PUFFS BY MOUTH EVERY 12 (TWELVE) HOURS., NEEDS AN APPT 02/05/15  Yes [provider]  busPIRone (BUSPAR) 5 MG tablet Take 5 mg by mouth 2 (two) times daily. 05/04/19  Yes [provider]  diphenhydrAMINE (SOMINEX) 25 MG tablet Take by mouth.   Yes [provider]  losartan (COZAAR) 25 MG tablet TAKE 1 TABLET BY MOUTH EVERY DAY 07/19/19  Yes Betancourt, Tina A, NP  NEEDLE, DISP, 25 G (BD ECLIPSE) 25G X 1-1/2" MISC 1 mL every 7 days 07/28/18  Yes [provider]  NEEDLE, REUSABLE, 16 G (YALE REUSABLE NEEDLE 16GX1.5") 16G X 1-1/2" MISC 0.5 ml every 7 days 07/28/18  Yes [provider]  omeprazole (PRILOSEC) 40 MG capsule TAKE 1 CAPSULE BY MOUTH EVERY DAY 07/12/19  Yes Betancourt, Aura Fey, NP   testosterone cypionate (DEPOTESTOSTERONE CYPIONATE) 200 MG/ML injection 0.25 mg. 03/04/19  Yes [provider]    Allergies Patient has no known allergies.  Family History  Problem Relation Age of Onset   Asthma Father    Lung cancer Father     Social History Social History   Tobacco Use   Smoking status: Never   Smokeless tobacco: Former    Review of Systems Constitutional: No fever/chills Eyes: No visual changes. ENT: No sore throat. Cardiovascular: Denies chest pain. Respiratory: Denies shortness of breath. Gastrointestinal: No abdominal pain.  No nausea, no vomiting.  No diarrhea.  No constipation. Genitourinary: Negative for dysuria. Musculoskeletal: Negative for back pain. Skin: Negative for rash. Neurological: Negative for headaches, focal weakness or numbness. ____________________________________________   PHYSICAL EXAM:  VITAL SIGNS: BP is 126/91, pulse 70, respiration 14, temperature 98.4, patient 90% O2 sat on room air.  Patient was 190 pounds and BMI is 25.77. Constitutional: Alert and oriented. Well appearing and in no acute distress. Eyes: Conjunctivae are normal. PERRL. EOMI. Head: Atraumatic. Nose: No congestion/rhinnorhea. Mouth/Throat: Mucous membranes are moist.  Oropharynx non-erythematous. Neck: No stridor.  No cervical spine tenderness to palpation. Hematological/Lymphatic/Immunilogical: No cervical lymphadenopathy. Cardiovascular: Normal rate, regular rhythm. Grossly normal heart sounds.  Good peripheral circulation. Respiratory: Normal respiratory effort.  No retractions. Lungs CTAB. Gastrointestinal: Soft and nontender. No distention. No abdominal bruits. No CVA tenderness. Genitourinary: Deferred Musculoskeletal: No lower extremity tenderness nor edema.  No joint effusions. Neurologic:  Normal speech and language. No gross focal neurologic deficits  are appreciated. No gait instability. Skin:  Skin is warm, dry and intact. No rash  noted.  Lower lumbar scar consistent with surgery. Psychiatric: Mood and affect are normal. Speech and behavior are normal.  ____________________________________________   LABS _Order: [id number] Status: Final result    Visible to patient: Yes (not seen)    Next appt: None    Dx: Annual physical exam    0 Result Notes        Component Ref Range & Units 6 d ago (03/18/22) 1 yr ago (03/08/21) 2 yr ago (02/27/20) 2 yr ago (06/28/19)  Glucose 70 - 99 mg/dL 97  95 R  90 R  100 High  R   Uric Acid 3.8 - 8.4 mg/dL 5.3  5.1 CM  4.7 CM  5.3 CM   Comment:            Therapeutic target for gout patients: <6.0  BUN 6 - 24 mg/dL _0 Creatinine, Ser 0.76 - 1.27 mg/dL 1.08  1.03  0.97  1.13   eGFR >59 mL/min/1.73 86  92     BUN/Creatinine Ratio 9 - _1 High   18   Sodium 134 - 144 mmol/L 139  139  140  139   Potassium 3.5 - 5.2 mmol/L 4.2  4.4  4.8  4.5   Chloride 96 - 106 mmol/L 102  101  102  100   Calcium 8.7 - 10.2 mg/dL 9.6  9.6  9.7  9.8   Phosphorus 2.8 - 4.1 mg/dL 2.7 Low   2.8  2.9  3.2   Total Protein 6.0 - 8.5 g/dL 6.4  6.9  7.2  6.9   Albumin 4.1 - 5.1 g/dL 4.6  4.8 R  4.9 R  4.6 R   Globulin, Total 1.5 - 4.5 g/dL 1.8  2.1  2.3  2.3   Albumin/Globulin Ratio 1.2 - 2.2 2.6 High   2.3 High   2.1  2.0   Bilirubin Total 0.0 - 1.2 mg/dL 0.6  0.7  0.6  0.9   Alkaline Phosphatase 44 - 121 IU/L 64  67  66 R  70 R   LDH 121 - 224 IU/L 147  155  158  135   AST 0 - 40 IU/L _2 ALT 0 - 44 IU/L 36  _3 GGT 0 - 65 IU/L _4 Iron 38 - 169 ug/dL 115  134  91  147   Cholesterol, Total 100 - 199 mg/dL 189  179  207 High   214 High    Triglycerides 0 - 149 mg/dL 96  130  101  103   HDL >39 mg/dL 45  45  51  50   VLDL Cholesterol Cal 5 - 40 mg/dL _5 LDL Chol Calc (NIH) 0 - 99 mg/dL 126 High   111 High   138 High   146 High    Chol/HDL Ratio 0.0 - 5.0 ratio 4.2  4.0 CM  4.1 CM  4.3 CM   Comment:                                    T. Chol/HDL Ratio  Men  Women                                1/2 Avg.Risk  3.4    3.3                                    Avg.Risk  5.0    4.4                                 2X Avg.Risk  9.6    7.1                                 3X Avg.Risk 23.4   11.0   Estimated CHD Risk 0.0 - 1.0 times avg. 0.8  0.7 CM  0.8 CM  0.8 CM   Comment: The CHD Risk is based on the T. Chol/HDL ratio. Other  factors affect CHD Risk such as hypertension, smoking,  diabetes, severe obesity, and family history of  premature CHD.   TSH 0.450 - 4.500 uIU/mL 2.180  2.020  1.730  2.120   T4, Total 4.5 - 12.0 ug/dL 4.9  5.4  4.7  4.7   T3 Uptake Ratio 24 - 39 % _0 Free Thyroxine Index 1.2 - 4.9 1.5  1.5  1.3  1.3   Prostate Specific Ag, Serum 0.0 - 4.0 ng/mL 1.3  1.3 CM  1.3 CM  1.3 CM   Comment: Roche ECLIA methodology.  According to the American Urological Association, Serum PSA should  decrease and remain at undetectable levels after radical  prostatectomy. The AUA defines biochemical recurrence as an initial  PSA value 0.2 ng/mL or greater followed by a subsequent confirmatory  PSA value 0.2 ng/mL or greater.  Values obtained with different assay methods or kits cannot be used  interchangeably. Results cannot be interpreted as absolute evidence  of the presence or absence of malignant disease.   WBC 3.4 - 10.8 x10E3/uL 4.9  4.8  5.1  5.2   RBC 4.14 - 5.80 x10E6/uL 5.45  5.47  6.13 High   5.59   Hemoglobin 13.0 - 17.7 g/dL 16.2  16.3  17.8 High   16.9   Hematocrit 37.5 - 51.0 % 47.0  47.8  53.7 High   49.6   MCV 79 - 97 fL 86  87  88  89   MCH 26.6 - 33.0 pg 29.7  29.8  29.0  30.2   MCHC 31.5 - 35.7 g/dL 34.5  34.1  33.1  34.1   RDW 11.6 - 15.4 % 12.5  12.5  13.2  11.8   Platelets 150 - 450 x10E3/uL 185  189  198  173   Neutrophils Not Estab. % 43  37  40  42   Lymphs Not Estab. % 43  47  42  40   Monocytes Not Estab. % _1 Eos Not Estab.  % _2 Basos Not Estab. % 1  0  0  1   Neutrophils Absolute 1.4 - 7.0 x10E3/uL 2.1  1.8  2.1  2.2   Lymphocytes Absolute 0.7 - 3.1  x10E3/uL 2.1  2.3  2.1  2.1   Monocytes Absolute 0.1 - 0.9 x10E3/uL 0.5  0.6  0.5  0.5   EOS (ABSOLUTE) 0.0 - 0.4 x10E3/uL 0.2  0.2  0.4  0.4   Basophils Absolute 0.0 - 0.2 x10E3/uL 0.0  0.0  0.0  0.0   Immature Granulocytes Not Estab. % 0  0  0  0            __________________________________________  EKG Sinus rhythm at 60 bpm  ____________________________________________   ____________________________________________   INITIAL IMPRESSION / ASSESSMENT AND PLAN  As part of my medical decision making, I reviewed the following data within the [place]      Discussed no acute findings on EKG and lab results.     ____________________________________________   FINAL CLINICAL IMPRESSION  Well exam   ED Discharge Orders     None        Note:  This document was prepared using Dragon voice recognition software and may include unintentional dictation errors.

## 2022-10-30 DIAGNOSIS — E349 Endocrine disorder, unspecified: Secondary | ICD-10-CM | POA: Diagnosis not present

## 2023-02-02 ENCOUNTER — Ambulatory Visit: Payer: Self-pay

## 2023-02-02 DIAGNOSIS — Z0289 Encounter for other administrative examinations: Secondary | ICD-10-CM

## 2023-02-02 LAB — POCT URINALYSIS DIPSTICK
Bilirubin, UA: NEGATIVE
Blood, UA: NEGATIVE
Glucose, UA: NEGATIVE
Ketones, UA: NEGATIVE
Leukocytes, UA: NEGATIVE
Nitrite, UA: NEGATIVE
Protein, UA: NEGATIVE
Spec Grav, UA: 1.025 (ref 1.010–1.025)
Urobilinogen, UA: 0.2 E.U./dL
pH, UA: 6 (ref 5.0–8.0)

## 2023-02-03 LAB — CMP12+LP+TP+TSH+6AC+PSA+CBC…
ALT: 20 IU/L (ref 0–44)
AST: 22 IU/L (ref 0–40)
Albumin: 4.7 g/dL (ref 4.1–5.1)
Alkaline Phosphatase: 78 IU/L (ref 44–121)
BUN/Creatinine Ratio: 18 (ref 9–20)
BUN: 21 mg/dL (ref 6–24)
Basophils Absolute: 0 10*3/uL (ref 0.0–0.2)
Basos: 0 %
Bilirubin Total: 0.4 mg/dL (ref 0.0–1.2)
Calcium: 9.8 mg/dL (ref 8.7–10.2)
Chloride: 103 mmol/L (ref 96–106)
Chol/HDL Ratio: 4.2 ratio (ref 0.0–5.0)
Cholesterol, Total: 188 mg/dL (ref 100–199)
Creatinine, Ser: 1.15 mg/dL (ref 0.76–1.27)
EOS (ABSOLUTE): 0.3 10*3/uL (ref 0.0–0.4)
Eos: 6 %
Estimated CHD Risk: 0.8 times avg. (ref 0.0–1.0)
Free Thyroxine Index: 1.5 (ref 1.2–4.9)
GGT: 11 IU/L (ref 0–65)
Globulin, Total: 1.8 g/dL (ref 1.5–4.5)
Glucose: 93 mg/dL (ref 70–99)
HDL: 45 mg/dL (ref >39)
Hematocrit: 52 % — ABNORMAL HIGH (ref 37.5–51.0)
Hemoglobin: 17.4 g/dL (ref 13.0–17.7)
Immature Grans (Abs): 0 10*3/uL (ref 0.0–0.1)
Immature Granulocytes: 0 %
Iron: 76 ug/dL (ref 38–169)
LDH: 153 IU/L (ref 121–224)
LDL Chol Calc (NIH): 131 mg/dL — ABNORMAL HIGH (ref 0–99)
Lymphocytes Absolute: 1.8 10*3/uL (ref 0.7–3.1)
Lymphs: 37 %
MCH: 29.9 pg (ref 26.6–33.0)
MCHC: 33.5 g/dL (ref 31.5–35.7)
MCV: 89 fL (ref 79–97)
Monocytes Absolute: 0.5 10*3/uL (ref 0.1–0.9)
Monocytes: 10 %
Neutrophils Absolute: 2.2 10*3/uL (ref 1.4–7.0)
Neutrophils: 47 %
Phosphorus: 3.1 mg/dL (ref 2.8–4.1)
Platelets: 205 10*3/uL (ref 150–450)
Potassium: 4.5 mmol/L (ref 3.5–5.2)
Prostate Specific Ag, Serum: 1.3 ng/mL (ref 0.0–4.0)
RBC: 5.82 x10E6/uL — ABNORMAL HIGH (ref 4.14–5.80)
RDW: 11.7 % (ref 11.6–15.4)
Sodium: 142 mmol/L (ref 134–144)
T3 Uptake Ratio: 29 % (ref 24–39)
T4, Total: 5.3 ug/dL (ref 4.5–12.0)
TSH: 2 u[IU]/mL (ref 0.450–4.500)
Total Protein: 6.5 g/dL (ref 6.0–8.5)
Triglycerides: 65 mg/dL (ref 0–149)
Uric Acid: 4.5 mg/dL (ref 3.8–8.4)
VLDL Cholesterol Cal: 12 mg/dL (ref 5–40)
WBC: 4.8 10*3/uL (ref 3.4–10.8)
eGFR: 80 mL/min/{1.73_m2} (ref >59)

## 2023-02-05 ENCOUNTER — Encounter: Payer: Self-pay | Admitting: Physician Assistant

## 2023-02-05 ENCOUNTER — Ambulatory Visit: Payer: Self-pay | Admitting: Physician Assistant

## 2023-02-05 VITALS — BP 136/90 | HR 56 | Temp 98.2°F | Resp 16 | Ht 72.0 in | Wt 195.0 lb

## 2023-02-05 DIAGNOSIS — Z0000 Encounter for general adult medical examination without abnormal findings: Secondary | ICD-10-CM

## 2023-02-05 NOTE — Progress Notes (Signed)
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  Denies complaints.

## 2023-02-05 NOTE — Progress Notes (Signed)
[facility] ____________________________________________   None    (approximate)  I have reviewed the triage vital signs and the nursing notes.   HISTORY  Chief Complaint No chief complaint on file.   HPI Zachary Gibson is a 47 y.o. 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  Patient voiced no concerns or complaints.         No past medical history on file.  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 Authorizing Provider  budesonide-formoterol (SYMBICORT) 80-4.5 MCG/ACT inhaler INHALE 2 PUFFS BY MOUTH EVERY 12 (TWELVE) HOURS., NEEDS AN APPT 02/05/15   [provider]  busPIRone (BUSPAR) 5 MG tablet Take 5 mg by mouth 2 (two) times daily. 05/04/19   [provider]  diphenhydrAMINE (SOMINEX) 25 MG tablet Take by mouth.    [provider]  losartan (COZAAR) 25 MG tablet TAKE 1 TABLET BY MOUTH EVERY DAY 07/19/19   Betancourt, Tina A, NP  NEEDLE, DISP, 25 G (BD ECLIPSE) 25G X 1-1/2" MISC 1 mL every 7 days 07/28/18   [provider]  NEEDLE, REUSABLE, 16 G (YALE REUSABLE NEEDLE 16GX1.5") 16G X 1-1/2" MISC 0.5 ml every 7 days 07/28/18   [provider]  omeprazole (PRILOSEC) 40 MG capsule TAKE 1 CAPSULE BY MOUTH EVERY DAY 07/12/19   Betancourt, Jarold Song, NP  testosterone cypionate  (DEPOTESTOSTERONE CYPIONATE) 200 MG/ML injection 0.25 mg. 03/04/19   [provider]    Allergies Patient has no known allergies.  Family History  Problem Relation Age of Onset   Asthma Father    Lung cancer Father     Social History Social History   Tobacco Use   Smoking status: Never   Smokeless tobacco: Former    Review of Systems  Constitutional: No fever/chills Eyes: No visual changes. ENT: No sore throat. Cardiovascular: Denies chest pain. Respiratory: Denies shortness of breath. Gastrointestinal: No abdominal pain.  No nausea, no vomiting.  No diarrhea.  No constipation. Genitourinary: Negative for dysuria. Musculoskeletal: Negative for back pain. Skin: Negative for rash. Neurological: Negative for headaches, focal weakness or numbness. Endocrine: Hypertension and Klinefelter syndrome   ____________________________________________   PHYSICAL EXAM:  VITAL SIGNS: BP 136/90  Pulse 56  Resp 16  Temp 98.2 F (36.8 C)  SpO2 98 %  Weight 195 lb (88.5 kg)  Height 6' (1.829 m)   BMI 26.45 kg/m2  BSA 2.12 m2   Constitutional: Alert and oriented. Well appearing and in no acute distress. Eyes: Conjunctivae are normal. PERRL. EOMI. Head: Atraumatic. Nose: No congestion/rhinnorhea. Mouth/Throat: Mucous membranes are moist.  Oropharynx non-erythematous. Neck: No stridor.  No cervical spine tenderness to palpation. Hematological/Lymphatic/Immunilogical: No cervical lymphadenopathy. Cardiovascular: Bradycardia, normal rate, regular rhythm. Grossly normal heart sounds.  Good peripheral circulation. Respiratory: Normal respiratory effort.  No retractions. Lungs CTAB. Gastrointestinal: Soft and nontender. No distention. No abdominal bruits. No  CVA tenderness. Genitourinary: Deferred Musculoskeletal: No lower extremity tenderness nor edema.  No joint effusions. Neurologic:  Normal speech and language. No gross focal neurologic deficits are appreciated. No  gait instability. Skin:  Skin is warm, dry and intact. No rash noted. Psychiatric: Mood and affect are normal. Speech and behavior are normal.  ____________________________________________   _____________________________________  EKG Sinus bradycardia at 56 bpm  ____________________________________________    ____________________________________________   INITIAL IMPRESSION / ASSESSMENT AND PLAN As part of my medical decision making, I reviewed the following data within the electronic MEDICAL RECORD NUMBER      No acute findings on physical exam, EKG, or labs.        ____________________________________________   FINAL CLINICAL IMPRESSION  Well exam   ED Discharge Orders     None        Note:  This document was prepared using Dragon voice recognition software and may include unintentional dictation errors.

## 2024-03-22 ENCOUNTER — Ambulatory Visit: Payer: Self-pay

## 2024-03-22 DIAGNOSIS — Z0289 Encounter for other administrative examinations: Secondary | ICD-10-CM

## 2024-03-22 LAB — POCT URINALYSIS DIPSTICK
Bilirubin, UA: NEGATIVE
Blood, UA: NEGATIVE
Glucose, UA: NEGATIVE
Ketones, UA: NEGATIVE
Leukocytes, UA: NEGATIVE
Nitrite, UA: NEGATIVE
Protein, UA: NEGATIVE
Spec Grav, UA: 1.015 (ref 1.010–1.025)
Urobilinogen, UA: 0.2 U/dL
pH, UA: 6 (ref 5.0–8.0)

## 2024-03-23 LAB — CMP12+LP+TP+TSH+6AC+PSA+CBC…
ALT: 23 IU/L (ref 0–44)
AST: 20 IU/L (ref 0–40)
Albumin: 4.8 g/dL (ref 4.1–5.1)
Alkaline Phosphatase: 68 IU/L (ref 44–121)
BUN/Creatinine Ratio: 15 (ref 9–20)
BUN: 18 mg/dL (ref 6–24)
Basophils Absolute: 0 x10E3/uL (ref 0.0–0.2)
Basos: 0 %
Bilirubin Total: 0.5 mg/dL (ref 0.0–1.2)
Calcium: 9.8 mg/dL (ref 8.7–10.2)
Chloride: 99 mmol/L (ref 96–106)
Chol/HDL Ratio: 5 ratio (ref 0.0–5.0)
Cholesterol, Total: 201 mg/dL — ABNORMAL HIGH (ref 100–199)
Creatinine, Ser: 1.22 mg/dL (ref 0.76–1.27)
EOS (ABSOLUTE): 0.3 x10E3/uL (ref 0.0–0.4)
Eos: 5 %
Estimated CHD Risk: 1 times avg. (ref 0.0–1.0)
Free Thyroxine Index: 1.5 (ref 1.2–4.9)
GGT: 13 IU/L (ref 0–65)
Globulin, Total: 2.2 g/dL (ref 1.5–4.5)
Glucose: 78 mg/dL (ref 70–99)
HDL: 40 mg/dL (ref >39)
Hematocrit: 52.5 % — ABNORMAL HIGH (ref 37.5–51.0)
Hemoglobin: 17.3 g/dL (ref 13.0–17.7)
Immature Grans (Abs): 0 x10E3/uL (ref 0.0–0.1)
Immature Granulocytes: 0 %
Iron: 80 ug/dL (ref 38–169)
LDH: 159 IU/L (ref 121–224)
LDL Chol Calc (NIH): 132 mg/dL — ABNORMAL HIGH (ref 0–99)
Lymphocytes Absolute: 2.1 x10E3/uL (ref 0.7–3.1)
Lymphs: 44 %
MCH: 29.9 pg (ref 26.6–33.0)
MCHC: 33 g/dL (ref 31.5–35.7)
MCV: 91 fL (ref 79–97)
Monocytes Absolute: 0.5 x10E3/uL (ref 0.1–0.9)
Monocytes: 10 %
Neutrophils Absolute: 2 x10E3/uL (ref 1.4–7.0)
Neutrophils: 41 %
Phosphorus: 2.5 mg/dL — ABNORMAL LOW (ref 2.8–4.1)
Platelets: 191 x10E3/uL (ref 150–450)
Potassium: 4.4 mmol/L (ref 3.5–5.2)
Prostate Specific Ag, Serum: 1.6 ng/mL (ref 0.0–4.0)
RBC: 5.78 x10E6/uL (ref 4.14–5.80)
RDW: 12.3 % (ref 11.6–15.4)
Sodium: 139 mmol/L (ref 134–144)
T3 Uptake Ratio: 29 % (ref 24–39)
T4, Total: 5.1 ug/dL (ref 4.5–12.0)
TSH: 2.38 u[IU]/mL (ref 0.450–4.500)
Total Protein: 7 g/dL (ref 6.0–8.5)
Triglycerides: 161 mg/dL — ABNORMAL HIGH (ref 0–149)
Uric Acid: 5.2 mg/dL (ref 3.8–8.4)
VLDL Cholesterol Cal: 29 mg/dL (ref 5–40)
WBC: 4.8 x10E3/uL (ref 3.4–10.8)
eGFR: 74 mL/min/1.73 (ref >59)

## 2024-03-31 ENCOUNTER — Encounter: Payer: Self-pay | Admitting: Physician Assistant

## 2024-03-31 ENCOUNTER — Ambulatory Visit: Payer: Self-pay | Admitting: Physician Assistant

## 2024-03-31 VITALS — BP 136/88 | HR 75 | Temp 97.7°F | Resp 16 | Ht 72.0 in | Wt 195.0 lb

## 2024-03-31 DIAGNOSIS — Z0000 Encounter for general adult medical examination without abnormal findings: Secondary | ICD-10-CM

## 2024-03-31 NOTE — Progress Notes (Signed)
 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

## 2024-03-31 NOTE — Progress Notes (Signed)
 [facility]  ____________________________________________   None    (approximate)  I have reviewed the triage vital signs and the nursing notes.   HISTORY  Chief Complaint Employment Physical   HPI Zachary Gibson is a 48 y.o. male patient presents for annual physical exam [place] [facility] [employer].  Patient endorses no concerns or complaints.        No past medical history on file.  Patient Active Problem List   Diagnosis Date Noted   Intervertebral disc disorder of lumbar region with myelopathy 10/12/2018   Left foot drop 10/12/2018   Lumbar stenosis with neurogenic claudication 10/12/2018   Sciatic scoliosis 10/12/2018   Central serous chorioretinopathy of left eye 07/08/2018   Hypertension 07/08/2018   Visual disturbance 07/08/2018   Klinefelter syndrome 06/16/2018   Low testosterone  06/16/2018   Asthma with allergic rhinitis 09/08/2012   Reflux 09/09/2011    Past Surgical History:  Procedure Laterality Date   BACK SURGERY  10/2018   L4-L5    Prior to Admission medications   Medication Sig Start Date End Date Taking? Authorizing Provider  budesonide-formoterol (SYMBICORT) 80-4.5 MCG/ACT inhaler INHALE 2 PUFFS BY MOUTH EVERY 12 (TWELVE) HOURS., NEEDS AN APPT Patient not taking: Reported on 02/05/2023 02/05/15   [provider]  busPIRone (BUSPAR) 5 MG tablet Take 5 mg by mouth 2 (two) times daily. 05/04/19   [provider]  diphenhydrAMINE (SOMINEX) 25 MG tablet Take by mouth.    [provider]  omeprazole  (PRILOSEC) 40 MG capsule TAKE 1 CAPSULE BY MOUTH EVERY DAY 07/12/19   Betancourt, Ellouise LABOR, NP  testosterone  cypionate (DEPOTESTOSTERONE CYPIONATE) 200 MG/ML injection 0.25 mg. 03/04/19   [provider]    Allergies Patient has no known allergies.  Family History  Problem Relation Age of Onset   Asthma Father    Lung cancer Father     Social History Social  History   Tobacco Use   Smoking status: Never   Smokeless tobacco: Former    Review of Systems Constitutional: No fever/chills Eyes: No visual changes. ENT: No sore throat. Cardiovascular: Denies chest pain. Respiratory: Denies shortness of breath. Gastrointestinal: No abdominal pain.  No nausea, no vomiting.  No diarrhea.  No constipation. Genitourinary: Negative for dysuria.  Klinefelter syndrome Musculoskeletal: Negative for back pain. Skin: Negative for rash. Neurological: Negative for headaches, focal weakness or numbness.  ____________________________________________   PHYSICAL EXAM:  VITAL SIGNS: BP 136/88  Cuff Size Large  Pulse Rate 75  Temp 97.7 F (36.5 C)  Temp Source Temporal  Weight 195 lb (88.5 kg)  Height 6' (1.829 m)  Resp 16   BMI: 26.45 kg/m2  BSA: 2.12 m2   Constitutional: Alert and oriented. Well appearing and in no acute distress. Eyes: Conjunctivae are normal. PERRL. EOMI. Head: Atraumatic. Nose: No congestion/rhinnorhea. Mouth/Throat: Mucous membranes are moist.  Oropharynx non-erythematous. Neck: No stridor.  No cervical spine tenderness to palpation. Hematological/Lymphatic/Immunilogical: No cervical lymphadenopathy. Cardiovascular: Normal rate, regular rhythm. Grossly normal heart sounds.  Good peripheral circulation. Respiratory: Normal respiratory effort.  No retractions. Lungs CTAB. Gastrointestinal: Soft and nontender. No distention. No abdominal bruits. No CVA tenderness. Genitourinary: Deferred Musculoskeletal: No lower extremity tenderness nor edema.  No joint effusions. Neurologic:  Normal speech and language. No gross focal neurologic deficits are appreciated. No gait instability. Skin:  Skin is warm, dry and intact. No rash noted. Psychiatric: Mood and affect are normal. Speech and behavior are normal.  ____________________________________________  LABS           Component Ref Range & Units (hover) 9 d ago (03/22/24) 1  yr ago (02/02/23) 2 yr ago (03/18/22) 3 yr ago (03/08/21) 4 yr ago (02/27/20) 4 yr ago (06/28/19)  Color, UA yellow yellow Yellow yellow yellow Yellow  Clarity, UA clear clear Clear clear clear Clear  Glucose, UA Negative Negative Negative Negative Negative Negative  Bilirubin, UA neg neg Negative negative negative Negative  Ketones, UA neg neg Negative negative negative Negative  Spec Grav, UA 1.015 1.025 1.020 1.015 1.020 1.020  Blood, UA neg neg Negative negative negative Negative  pH, UA 6.0 6.0 7.0 7.0 6.0 7.0  Protein, UA Negative Negative Negative Negative Negative Negative  Urobilinogen, UA 0.2 0.2 0.2 0.2 0.2 0.2  Nitrite, UA neg neg Negative negative negative Negative  Leukocytes, UA Negative Negative Negative Negative Negative Negative  Appearance    normal dark   Odor                    Component Ref Range & Units (hover) 9 d ago (03/22/24) 1 yr ago (02/02/23) 2 yr ago (03/18/22) 3 yr ago (03/08/21) 4 yr ago (02/27/20) 4 yr ago (06/28/19)  Glucose 78 93 97 95 R 90 R 100 High  R  Uric Acid 5.2 4.5 CM 5.3 CM 5.1 CM 4.7 CM 5.3 CM  Comment:            Therapeutic target for gout patients: <6.0  BUN 18 21 16 16 22 20   Creatinine, Ser 1.22 1.15 1.08 1.03 0.97 1.13  eGFR 74 80 86 92    BUN/Creatinine Ratio 15 18 15 16 23  High  18  Sodium 139 142 139 139 140 139  Potassium 4.4 4.5 4.2 4.4 4.8 4.5  Chloride 99 103 102 101 102 100  Calcium 9.8 9.8 9.6 9.6 9.7 9.8  Phosphorus 2.5 Low  3.1 2.7 Low  2.8 2.9 3.2  Total Protein 7.0 6.5 6.4 6.9 7.2 6.9  Albumin 4.8 4.7 4.6 4.8 R 4.9 R 4.6 R  Globulin, Total 2.2 1.8 1.8 2.1 2.3 2.3  Bilirubin Total 0.5 0.4 0.6 0.7 0.6 0.9  Alkaline Phosphatase 68 78 64 67 66 R 70 R  Comment: **Effective March 28, 2024 Alkaline Phosphatase**   reference interval will be changing to:              Age                Male          Male           0 -  5 days         47 - 127       47 - 127           6 - 10 days         29 - 242       29 - 242           11 - 20 days        109 - 357      109 - 357          21 - 30 days         94 - 494       94 - 494           1 -  2 months      149 - 539  149 - 539           3 -  6 months      131 - 452      131 - 452           7 - 11 months      117 - 401      117 - 401   12 months -  6 years       158 - 369      158 - 369           7 - 12 years       150 - 409      150 - 409               13 years       156 - 435       78 - 227               14 years       114 - 375       64 - 161               15 years        88 - 279       56 - 134               16 years        74 - 207       51 - 121               17 years        63 - 161       47 - 113          18 - 20 years        51 - 125       42 - 106          21 - 50 years        47 - 123       41 - 116          51 - 80 years        49 - 135       51 - 125              >80 years        48 - 129       48 - 129  LDH 159 153 147 155 158 135  AST 20 22 25 19 20 15   ALT 23 20 36 21 21 15   GGT 13 11 15 13 12 11   Iron 80 76 115 134 91 147  Cholesterol, Total 201 High  188 189 179 207 High  214 High   Triglycerides 161 High  65 96 130 101 103  HDL 40 45 45 45 51 50  VLDL Cholesterol Cal 29 12 18 23 18 18   LDL Chol Calc (NIH) 132 High  131 High  126 High  111 High  138 High  146 High   Chol/HDL Ratio 5.0 4.2 CM 4.2 CM 4.0 CM 4.1 CM 4.3 CM  Comment:                                   T. Chol/HDL Ratio  Men  Women                               1/2 Avg.Risk  3.4    3.3                                   Avg.Risk  5.0    4.4                                2X Avg.Risk  9.6    7.1                                3X Avg.Risk 23.4   11.0  Estimated CHD Risk 1.0 0.8 CM 0.8 CM 0.7 CM 0.8 CM 0.8 CM  Comment: The CHD Risk is based on the T. Chol/HDL ratio. Other factors affect CHD Risk such as hypertension, smoking, diabetes, severe obesity, and family history of premature CHD.  TSH 2.380 2.000 2.180 2.020 1.730 2.120   T4, Total 5.1 5.3 4.9 5.4 4.7 4.7  T3 Uptake Ratio 29 29 30 28 28 27   Free Thyroxine Index 1.5 1.5 1.5 1.5 1.3 1.3  Prostate Specific Ag, Serum 1.6 1.3 CM 1.3 CM 1.3 CM 1.3 CM 1.3 CM  Comment: Roche ECLIA methodology. According to the American Urological Association, Serum PSA should decrease and remain at undetectable levels after radical prostatectomy. The AUA defines biochemical recurrence as an initial PSA value 0.2 ng/mL or greater followed by a subsequent confirmatory PSA value 0.2 ng/mL or greater. Values obtained with different assay methods or kits cannot be used interchangeably. Results cannot be interpreted as absolute evidence of the presence or absence of malignant disease.  WBC 4.8 4.8 4.9 4.8 5.1 5.2  RBC 5.78 5.82 High  5.45 5.47 6.13 High  5.59  Hemoglobin 17.3 17.4 16.2 16.3 17.8 High  16.9  Hematocrit 52.5 High  52.0 High  47.0 47.8 53.7 High  49.6  MCV 91 89 86 87 88 89  MCH 29.9 29.9 29.7 29.8 29.0 30.2  MCHC 33.0 33.5 34.5 34.1 33.1 34.1  RDW 12.3 11.7 12.5 12.5 13.2 11.8  Platelets 191 205 185 189 198 173  Neutrophils 41 47 43 37 40 42  Lymphs 44 37 43 47 42 40  Monocytes 10 10 9 12 10 9   Eos 5 6 4 4 8 8   Basos 0 0 1 0 0 1  Neutrophils Absolute 2.0 2.2 2.1 1.8 2.1 2.2  Lymphocytes Absolute 2.1 1.8 2.1 2.3 2.1 2.1  Monocytes Absolute 0.5 0.5 0.5 0.6 0.5 0.5  EOS (ABSOLUTE) 0.3 0.3 0.2 0.2 0.4 0.4  Basophils Absolute 0.0 0.0 0.0 0.0 0.0 0.0  Immature Granulocytes 0 0 0 0 0 0  Immature Grans (Abs) 0.0 0.0 0.0 0.0 0.0 0.0                  ____________________________________________  EKG  Sinus bradycardia 59 bpm ____________________________________________    ____________________________________________   INITIAL IMPRESSION / ASSESSMENT AND PLANAs part of my medical decision making, I reviewed the following data within the electronic MEDICAL RECORD NUMBER      No acute findings on physical exam, EKG, or labs.         ____________________________________________  FINAL CLINICAL IMPRESSION Well exam   ED Discharge Orders     None        Note:  This document was prepared using Dragon voice recognition software and may include unintentional dictation errors.
